# Patient Record
Sex: Male | Born: 1960 | Race: White | Hispanic: No | Marital: Single | Smoking: Former smoker
Health system: Southern US, Community
[De-identification: ages and names within clinical notes are randomized; demographics above are authoritative.]

## PROBLEM LIST (undated history)

## (undated) DIAGNOSIS — E7800 Pure hypercholesterolemia, unspecified: Secondary | ICD-10-CM

## (undated) DIAGNOSIS — I10 Essential (primary) hypertension: Secondary | ICD-10-CM

## (undated) DIAGNOSIS — M1990 Unspecified osteoarthritis, unspecified site: Secondary | ICD-10-CM

## (undated) DIAGNOSIS — J439 Emphysema, unspecified: Secondary | ICD-10-CM

## (undated) HISTORY — PX: HERNIA REPAIR: SHX51

## (undated) HISTORY — PX: COLONOSCOPY: SHX174

---

## 2002-07-29 ENCOUNTER — Ambulatory Visit (HOSPITAL_COMMUNITY): Admission: RE | Admit: 2002-07-29 | Discharge: 2002-07-29 | Payer: Self-pay | Admitting: Internal Medicine

## 2004-08-22 ENCOUNTER — Emergency Department (HOSPITAL_COMMUNITY): Admission: EM | Admit: 2004-08-22 | Discharge: 2004-08-22 | Payer: Self-pay | Admitting: Family Medicine

## 2004-08-22 ENCOUNTER — Ambulatory Visit (HOSPITAL_COMMUNITY): Admission: RE | Admit: 2004-08-22 | Discharge: 2004-08-22 | Payer: Self-pay | Admitting: Family Medicine

## 2008-01-22 ENCOUNTER — Ambulatory Visit: Payer: Self-pay | Admitting: Family Medicine

## 2013-12-24 ENCOUNTER — Emergency Department: Payer: Self-pay | Admitting: Emergency Medicine

## 2013-12-24 LAB — BASIC METABOLIC PANEL
ANION GAP: 8 (ref 7–16)
BUN: 17 mg/dL (ref 7–18)
Calcium, Total: 8.7 mg/dL (ref 8.5–10.1)
Chloride: 104 mmol/L (ref 98–107)
Co2: 26 mmol/L (ref 21–32)
Creatinine: 1.17 mg/dL (ref 0.60–1.30)
EGFR (Non-African Amer.): >60
Glucose: 104 mg/dL — ABNORMAL HIGH (ref 65–99)
Osmolality: 278 (ref 275–301)
Potassium: 3.2 mmol/L — ABNORMAL LOW (ref 3.5–5.1)
SODIUM: 138 mmol/L (ref 136–145)

## 2013-12-24 LAB — CBC
HCT: 43 % (ref 40.0–52.0)
HGB: 15.1 g/dL (ref 13.0–18.0)
MCH: 33.5 pg (ref 26.0–34.0)
MCHC: 35.2 g/dL (ref 32.0–36.0)
MCV: 95 fL (ref 80–100)
PLATELETS: 151 10*3/uL (ref 150–440)
RBC: 4.52 10*6/uL (ref 4.40–5.90)
RDW: 12.7 % (ref 11.5–14.5)
WBC: 6.3 10*3/uL (ref 3.8–10.6)

## 2013-12-24 LAB — TROPONIN I: Troponin-I: <0.02 ng/mL

## 2017-11-14 ENCOUNTER — Telehealth: Payer: Self-pay | Admitting: *Deleted

## 2017-11-14 DIAGNOSIS — Z122 Encounter for screening for malignant neoplasm of respiratory organs: Secondary | ICD-10-CM

## 2017-11-14 NOTE — Telephone Encounter (Signed)
Received a referral for initial lung cancer screening scan.  Contacted the patient and obtained their smoking history, CURRENT SMOKER, SMOKE SOME YOUNGER BUT STARTED REGULAR AT 57 YEARS OLD, 33 PKYR HISTORY as well as answering questions related to screening process.  Patient denies signs of lung cancer such as weight loss or hemoptysis at this time.  Patient denies comorbidity that would prevent curative treatment if lung cancer were found.  Patient is scheduled for the Shared Decision Making Visit and CT scan on 9-12@1430 .

## 2017-11-14 NOTE — Telephone Encounter (Signed)
Received referral for low dose lung cancer screening CT scan.  Unable to leave message at this time, will attempt call at later point.       

## 2017-12-04 ENCOUNTER — Ambulatory Visit: Payer: Self-pay | Admitting: Nurse Practitioner

## 2017-12-04 ENCOUNTER — Inpatient Hospital Stay: Payer: BLUE CROSS/BLUE SHIELD | Attending: Nurse Practitioner | Admitting: Nurse Practitioner

## 2017-12-04 ENCOUNTER — Encounter: Payer: Self-pay | Admitting: Nurse Practitioner

## 2017-12-04 ENCOUNTER — Ambulatory Visit
Admission: RE | Admit: 2017-12-04 | Discharge: 2017-12-04 | Disposition: A | Discharge status: Home / Self Care | Payer: BLUE CROSS/BLUE SHIELD | Source: Ambulatory Visit | Attending: Nurse Practitioner | Admitting: Nurse Practitioner

## 2017-12-04 DIAGNOSIS — J432 Centrilobular emphysema: Secondary | ICD-10-CM | POA: Diagnosis not present

## 2017-12-04 DIAGNOSIS — I2510 Atherosclerotic heart disease of native coronary artery without angina pectoris: Secondary | ICD-10-CM | POA: Diagnosis not present

## 2017-12-04 DIAGNOSIS — Z122 Encounter for screening for malignant neoplasm of respiratory organs: Secondary | ICD-10-CM | POA: Diagnosis present

## 2017-12-04 DIAGNOSIS — I700 Atherosclerosis of aorta: Secondary | ICD-10-CM | POA: Diagnosis not present

## 2017-12-04 DIAGNOSIS — Z87891 Personal history of nicotine dependence: Secondary | ICD-10-CM | POA: Insufficient documentation

## 2017-12-04 NOTE — Progress Notes (Signed)
In accordance with CMS guidelines, patient has met eligibility criteria including age, absence of signs or symptoms of lung cancer.  Social History   Tobacco Use  . Smoking status: Current Every Day Smoker    Packs/day: 1.50    Years: 22.00    Pack years: 33.00    Types: Cigarettes  Substance Use Topics  . Alcohol use: Not on file  . Drug use: Not on file      A shared decision-making session was conducted prior to the performance of CT scan. This includes one or more decision aids, includes benefits and harms of screening, follow-up diagnostic testing, over-diagnosis, false positive rate, and total radiation exposure.   Counseling on the importance of adherence to annual lung cancer LDCT screening, impact of co-morbidities, and ability or willingness to undergo diagnosis and treatment is imperative for compliance of the program.   Counseling on the importance of continued smoking cessation for former smokers; the importance of smoking cessation for current smokers, and information about tobacco cessation interventions have been given to patient including [place] and [facility] quit [place] programs.   Written order for lung cancer screening with LDCT has been given to the patient and any and all questions have been answered to the best of my abilities.    Yearly follow up will be coordinated by Burgess Estelle, Thoracic Navigator.  Beckey Rutter, DNP, AGNP-C [place] [facility] [phone] (work cell) [phone] (office) 12/04/17 2:23 PM

## 2017-12-05 ENCOUNTER — Encounter: Payer: Self-pay | Admitting: *Deleted

## 2017-12-10 ENCOUNTER — Other Ambulatory Visit: Payer: Self-pay

## 2017-12-10 ENCOUNTER — Encounter: Payer: Self-pay | Admitting: *Deleted

## 2017-12-11 NOTE — Discharge Instructions (Signed)

## 2017-12-16 ENCOUNTER — Encounter
Admission: RE | Disposition: A | Discharge status: Home / Self Care | Payer: Self-pay | Source: Ambulatory Visit | Attending: Ophthalmology

## 2017-12-16 ENCOUNTER — Ambulatory Visit
Admission: RE | Admit: 2017-12-16 | Discharge: 2017-12-16 | Disposition: A | Discharge status: Home / Self Care | Payer: BLUE CROSS/BLUE SHIELD | Source: Ambulatory Visit | Attending: Ophthalmology | Admitting: Ophthalmology

## 2017-12-16 ENCOUNTER — Ambulatory Visit: Payer: BLUE CROSS/BLUE SHIELD | Admitting: Anesthesiology

## 2017-12-16 DIAGNOSIS — H2511 Age-related nuclear cataract, right eye: Secondary | ICD-10-CM | POA: Diagnosis not present

## 2017-12-16 DIAGNOSIS — Z79899 Other long term (current) drug therapy: Secondary | ICD-10-CM | POA: Insufficient documentation

## 2017-12-16 DIAGNOSIS — I10 Essential (primary) hypertension: Secondary | ICD-10-CM | POA: Diagnosis not present

## 2017-12-16 DIAGNOSIS — J439 Emphysema, unspecified: Secondary | ICD-10-CM | POA: Insufficient documentation

## 2017-12-16 DIAGNOSIS — F17200 Nicotine dependence, unspecified, uncomplicated: Secondary | ICD-10-CM | POA: Insufficient documentation

## 2017-12-16 HISTORY — DX: Essential (primary) hypertension: I10

## 2017-12-16 HISTORY — DX: Emphysema, unspecified: J43.9

## 2017-12-16 HISTORY — DX: Unspecified osteoarthritis, unspecified site: M19.90

## 2017-12-16 HISTORY — DX: Pure hypercholesterolemia, unspecified: E78.00

## 2017-12-16 HISTORY — PX: CATARACT EXTRACTION W/PHACO: SHX586

## 2017-12-16 SURGERY — PHACOEMULSIFICATION, CATARACT, WITH IOL INSERTION
Anesthesia: Monitor Anesthesia Care | Site: Eye | Laterality: Right

## 2017-12-16 MED ORDER — LACTATED RINGERS IV SOLN: 10.0000 mL/h | INTRAVENOUS | Status: DC

## 2017-12-16 MED ORDER — ONDANSETRON HCL 4 MG/2ML IJ SOLN: 4.0000 mg | Freq: Once | INTRAMUSCULAR | Status: DC | PRN

## 2017-12-16 MED ORDER — LIDOCAINE HCL (PF) 2 % IJ SOLN
INTRAOCULAR | Status: DC | PRN
  Administered 2017-12-16: 1 mL

## 2017-12-16 MED ORDER — FENTANYL CITRATE (PF) 100 MCG/2ML IJ SOLN
INTRAMUSCULAR | Status: DC | PRN
  Administered 2017-12-16: 50 ug via INTRAVENOUS

## 2017-12-16 MED ORDER — NA HYALUR & NA CHOND-NA HYALUR 0.4-0.35 ML IO KIT
PACK | INTRAOCULAR | Status: DC | PRN
  Administered 2017-12-16: 1 mL via INTRAOCULAR

## 2017-12-16 MED ORDER — EPINEPHRINE PF 1 MG/ML IJ SOLN
INTRAOCULAR | Status: DC | PRN
  Administered 2017-12-16: 56 mL via OPHTHALMIC

## 2017-12-16 MED ORDER — TETRACAINE HCL 0.5 % OP SOLN
1.0000 [drp] | OPHTHALMIC | Status: DC | PRN
  Administered 2017-12-16 (×2): 1 [drp] via OPHTHALMIC

## 2017-12-16 MED ORDER — ARMC OPHTHALMIC DILATING DROPS
1.0000 "application " | OPHTHALMIC | Status: DC | PRN
  Administered 2017-12-16 (×3): 1 via OPHTHALMIC

## 2017-12-16 MED ORDER — MOXIFLOXACIN HCL 0.5 % OP SOLN
1.0000 [drp] | OPHTHALMIC | Status: DC | PRN
  Administered 2017-12-16 (×3): 1 [drp] via OPHTHALMIC

## 2017-12-16 MED ORDER — CEFUROXIME OPHTHALMIC INJECTION 1 MG/0.1 ML
INJECTION | OPHTHALMIC | Status: DC | PRN
  Administered 2017-12-16: 0.1 mL via INTRACAMERAL

## 2017-12-16 MED ORDER — BRIMONIDINE TARTRATE-TIMOLOL 0.2-0.5 % OP SOLN
OPHTHALMIC | Status: DC | PRN
  Administered 2017-12-16: 1 [drp] via OPHTHALMIC

## 2017-12-16 MED ORDER — MIDAZOLAM HCL 2 MG/2ML IJ SOLN
INTRAMUSCULAR | Status: DC | PRN
  Administered 2017-12-16: 2 mg via INTRAVENOUS

## 2017-12-16 SURGICAL SUPPLY — 20 items
CANNULA ANT/CHMB 27G (MISCELLANEOUS) ×1 IMPLANT
CANNULA ANT/CHMB 27GA (MISCELLANEOUS) ×3 IMPLANT
GLOVE SURG LX 7.5 STRW (GLOVE) ×2
GLOVE SURG LX STRL 7.5 STRW (GLOVE) ×1 IMPLANT
GLOVE SURG TRIUMPH 8.0 PF LTX (GLOVE) ×3 IMPLANT
GOWN STRL REUS W/ TWL LRG LVL3 (GOWN DISPOSABLE) ×2 IMPLANT
GOWN STRL REUS W/TWL LRG LVL3 (GOWN DISPOSABLE) ×4
LENS IOL TECNIS ITEC 21.0 (Intraocular Lens) ×2 IMPLANT
MARKER SKIN DUAL TIP RULER LAB (MISCELLANEOUS) ×3 IMPLANT
NDL FILTER BLUNT 18X1 1/2 (NEEDLE) ×1 IMPLANT
NEEDLE FILTER BLUNT 18X 1/2SAF (NEEDLE) ×2
NEEDLE FILTER BLUNT 18X1 1/2 (NEEDLE) ×1 IMPLANT
PACK CATARACT BRASINGTON (MISCELLANEOUS) ×3 IMPLANT
PACK EYE AFTER SURG (MISCELLANEOUS) ×3 IMPLANT
PACK OPTHALMIC (MISCELLANEOUS) ×3 IMPLANT
SYR 3ML LL SCALE MARK (SYRINGE) ×3 IMPLANT
SYR 5ML LL (SYRINGE) ×3 IMPLANT
SYR TB 1ML LUER SLIP (SYRINGE) ×3 IMPLANT
WATER STERILE IRR 500ML POUR (IV SOLUTION) ×3 IMPLANT
WIPE NON LINTING 3.25X3.25 (MISCELLANEOUS) ×3 IMPLANT

## 2017-12-16 NOTE — Anesthesia Procedure Notes (Signed)
Procedure Name: [place] Performed by: Cameron Ali, CRNA Pre-anesthesia Checklist: Patient identified, Emergency Drugs available, Suction available, Timeout performed and Patient being monitored Patient Re-evaluated:Patient Re-evaluated prior to induction Oxygen Delivery Method: Nasal cannula Placement Confirmation: positive ETCO2

## 2017-12-16 NOTE — Transfer of Care (Signed)
Immediate Anesthesia Transfer of Care Note  Patient: Cory Macias  Procedure(s) Performed: CATARACT EXTRACTION PHACO AND INTRAOCULAR LENS PLACEMENT (IOC) RIGHT (Right Eye)  Patient Location: PACU  Anesthesia Type: MAC  Level of Consciousness: awake, alert  and patient cooperative  Airway and Oxygen Therapy: Patient Spontanous Breathing and Patient connected to supplemental oxygen  Post-op Assessment: Post-op Vital signs reviewed, Patient's Cardiovascular Status Stable, Respiratory Function Stable, Patent Airway and No signs of Nausea or vomiting  Post-op Vital Signs: Reviewed and stable  Complications: No apparent anesthesia complications

## 2017-12-16 NOTE — Op Note (Signed)
LOCATION:  [facility]   PREOPERATIVE DIAGNOSIS:    Nuclear sclerotic cataract right eye. H25.11   POSTOPERATIVE DIAGNOSIS:  Nuclear sclerotic cataract right eye.     PROCEDURE:  Phacoemusification with posterior chamber intraocular lens placement of the right eye   LENS:   Implant Name Type Inv. Item Serial No. Manufacturer Lot No. LRB No. Used  LENS IOL DIOP 21.0 - [id number] Intraocular Lens LENS IOL DIOP 21.0 [id number]  Right 1        ULTRASOUND TIME: 13 % of 1 minutes, 7 seconds.  CDE 8.5   SURGEON:  Deirdre Evenerhadwick R. Nakyra Bourn, MD   ANESTHESIA:  Topical with tetracaine drops and 2% Xylocaine jelly, augmented with 1% preservative-free intracameral lidocaine.    COMPLICATIONS:  None.   DESCRIPTION OF PROCEDURE:  The patient was identified in the holding room and transported to the operating room and placed in the supine position under the operating microscope.  The right eye was identified as the operative eye and it was prepped and draped in the usual sterile ophthalmic fashion.   A 1 millimeter clear-corneal paracentesis was made at the 12:00 position.  0.5 ml of preservative-free 1% lidocaine was injected into the anterior chamber. The anterior chamber was filled with Viscoat viscoelastic.  A 2.4 millimeter keratome was used to make a near-clear corneal incision at the 9:00 position.  A curvilinear capsulorrhexis was made with a cystotome and capsulorrhexis forceps.  Balanced salt solution was used to hydrodissect and hydrodelineate the nucleus.   Phacoemulsification was then used in stop and chop fashion to remove the lens nucleus and epinucleus.  The remaining cortex was then removed using the irrigation and aspiration handpiece. Provisc was then placed into the capsular bag to distend it for lens placement.  A lens was then injected into the capsular bag.  The remaining viscoelastic was aspirated.   Wounds were hydrated with balanced salt solution.  The anterior  chamber was inflated to a physiologic pressure with balanced salt solution.  No wound leaks were noted. Cefuroxime 0.1 ml of a 10mg /ml solution was injected into the anterior chamber for a dose of 1 mg of intracameral antibiotic at the completion of the case.   Timolol and Brimonidine drops were applied to the eye.  The patient was taken to the recovery room in stable condition without complications of anesthesia or surgery.   Delayni Streed 12/16/2017, 12:27 PM

## 2017-12-16 NOTE — H&P (Signed)
The History and Physical notes are on paper, have been signed, and are to be scanned. The patient remains stable and unchanged from the H&P.   Previous H&P reviewed, patient examined, and there are no changes.  Cory Macias 12/16/2017 11:24 AM   

## 2017-12-16 NOTE — Anesthesia Postprocedure Evaluation (Signed)
Anesthesia Post Note  Patient: Cory Macias  Procedure(s) Performed: CATARACT EXTRACTION PHACO AND INTRAOCULAR LENS PLACEMENT (IOC) RIGHT (Right Eye)  Patient location during evaluation: PACU Anesthesia Type: MAC Level of consciousness: awake and alert Pain management: pain level controlled Vital Signs Assessment: post-procedure vital signs reviewed and stable Respiratory status: spontaneous breathing, nonlabored ventilation, respiratory function stable and patient connected to nasal cannula oxygen Cardiovascular status: stable and blood pressure returned to baseline Postop Assessment: no apparent nausea or vomiting Anesthetic complications: no    Veda Canning

## 2017-12-16 NOTE — Anesthesia Preprocedure Evaluation (Signed)
Anesthesia Evaluation  Patient identified by MRN, date of birth, ID band Patient awake    Reviewed: Allergy & Precautions, NPO status , Patient's Chart, lab work & pertinent test results  Airway Mallampati: II  TM Distance: >3 FB     Dental   Pulmonary COPD, Current Smoker,    breath sounds clear to auscultation       Cardiovascular hypertension,  Rhythm:Regular Rate:Normal  HLD   Neuro/Psych    GI/Hepatic   Endo/Other  BMI 36  Renal/GU      Musculoskeletal  (+) Arthritis ,   Abdominal   Peds  Hematology   Anesthesia Other Findings   Reproductive/Obstetrics                             Anesthesia Physical Anesthesia Plan  ASA: III  Anesthesia Plan: MAC   Post-op Pain Management:    Induction:   PONV Risk Score and Plan:   Airway Management Planned: Nasal Cannula and Natural Airway  Additional Equipment:   Intra-op Plan:   Post-operative Plan:   Informed Consent: I have reviewed the patients History and Physical, chart, labs and discussed the procedure including the risks, benefits and alternatives for the proposed anesthesia with the patient or authorized representative who has indicated his/her understanding and acceptance.     Plan Discussed with: CRNA  Anesthesia Plan Comments:         Anesthesia Quick Evaluation

## 2017-12-17 ENCOUNTER — Encounter: Payer: Self-pay | Admitting: Ophthalmology

## 2018-01-19 DIAGNOSIS — H2512 Age-related nuclear cataract, left eye: Secondary | ICD-10-CM | POA: Diagnosis not present

## 2018-01-21 ENCOUNTER — Other Ambulatory Visit: Payer: Self-pay

## 2018-01-21 ENCOUNTER — Encounter: Payer: Self-pay | Admitting: *Deleted

## 2018-01-21 NOTE — Anesthesia Preprocedure Evaluation (Addendum)
Anesthesia Evaluation  Patient identified by MRN, date of birth, ID band Patient awake    Reviewed: Allergy & Precautions, NPO status , Patient's Chart, lab work & pertinent test results  Airway Mallampati: III  TM Distance: >3 FB Neck ROM: Full    Dental  (+)    Pulmonary COPD (emphysema), Current Smoker (1/2 ppd),    Pulmonary exam normal breath sounds clear to auscultation       Cardiovascular Exercise Tolerance: Good hypertension, Normal cardiovascular exam Rhythm:Regular Rate:Normal     Neuro/Psych negative neurological ROS     GI/Hepatic negative GI ROS,   Endo/Other  negative endocrine ROS  Renal/GU Hx nephrolithiasis     Musculoskeletal  (+) Arthritis , Gout    Abdominal   Peds  Hematology negative hematology ROS (+)   Anesthesia Other Findings   Reproductive/Obstetrics                            Anesthesia Physical Anesthesia Plan  ASA: II  Anesthesia Plan: MAC   Post-op Pain Management:    Induction: Intravenous  PONV Risk Score and Plan: TIVA and Midazolam  Airway Management Planned: Natural Airway  Additional Equipment:   Intra-op Plan:   Post-operative Plan:   Informed Consent: I have reviewed the patients History and Physical, chart, labs and discussed the procedure including the risks, benefits and alternatives for the proposed anesthesia with the patient or authorized representative who has indicated his/her understanding and acceptance.     Plan Discussed with: CRNA  Anesthesia Plan Comments:        Anesthesia Quick Evaluation

## 2018-01-22 NOTE — Discharge Instructions (Signed)

## 2018-01-28 ENCOUNTER — Ambulatory Visit
Admission: RE | Admit: 2018-01-28 | Discharge: 2018-01-28 | Disposition: A | Discharge status: Home / Self Care | Payer: 59 | Source: Ambulatory Visit | Attending: Ophthalmology | Admitting: Ophthalmology

## 2018-01-28 ENCOUNTER — Ambulatory Visit: Payer: 59 | Admitting: Anesthesiology

## 2018-01-28 ENCOUNTER — Encounter
Admission: RE | Disposition: A | Discharge status: Home / Self Care | Payer: Self-pay | Source: Ambulatory Visit | Attending: Ophthalmology

## 2018-01-28 DIAGNOSIS — F17200 Nicotine dependence, unspecified, uncomplicated: Secondary | ICD-10-CM | POA: Insufficient documentation

## 2018-01-28 DIAGNOSIS — Z9841 Cataract extraction status, right eye: Secondary | ICD-10-CM | POA: Diagnosis not present

## 2018-01-28 DIAGNOSIS — R609 Edema, unspecified: Secondary | ICD-10-CM | POA: Insufficient documentation

## 2018-01-28 DIAGNOSIS — Z87442 Personal history of urinary calculi: Secondary | ICD-10-CM | POA: Insufficient documentation

## 2018-01-28 DIAGNOSIS — H2512 Age-related nuclear cataract, left eye: Secondary | ICD-10-CM | POA: Diagnosis present

## 2018-01-28 DIAGNOSIS — E7800 Pure hypercholesterolemia, unspecified: Secondary | ICD-10-CM | POA: Insufficient documentation

## 2018-01-28 DIAGNOSIS — J439 Emphysema, unspecified: Secondary | ICD-10-CM | POA: Insufficient documentation

## 2018-01-28 DIAGNOSIS — R05 Cough: Secondary | ICD-10-CM | POA: Diagnosis not present

## 2018-01-28 DIAGNOSIS — M109 Gout, unspecified: Secondary | ICD-10-CM | POA: Insufficient documentation

## 2018-01-28 DIAGNOSIS — H25812 Combined forms of age-related cataract, left eye: Secondary | ICD-10-CM | POA: Diagnosis not present

## 2018-01-28 DIAGNOSIS — I10 Essential (primary) hypertension: Secondary | ICD-10-CM | POA: Diagnosis not present

## 2018-01-28 DIAGNOSIS — Z79899 Other long term (current) drug therapy: Secondary | ICD-10-CM | POA: Insufficient documentation

## 2018-01-28 DIAGNOSIS — M1990 Unspecified osteoarthritis, unspecified site: Secondary | ICD-10-CM | POA: Diagnosis not present

## 2018-01-28 HISTORY — PX: CATARACT EXTRACTION W/PHACO: SHX586

## 2018-01-28 SURGERY — PHACOEMULSIFICATION, CATARACT, WITH IOL INSERTION
Anesthesia: Monitor Anesthesia Care | Site: Eye | Laterality: Left

## 2018-01-28 MED ORDER — BRIMONIDINE TARTRATE-TIMOLOL 0.2-0.5 % OP SOLN
OPHTHALMIC | Status: DC | PRN
  Administered 2018-01-28: 1 [drp] via OPHTHALMIC

## 2018-01-28 MED ORDER — CEFUROXIME OPHTHALMIC INJECTION 1 MG/0.1 ML
INJECTION | OPHTHALMIC | Status: DC | PRN
  Administered 2018-01-28: 0.1 mL via INTRACAMERAL

## 2018-01-28 MED ORDER — TETRACAINE HCL 0.5 % OP SOLN
1.0000 [drp] | OPHTHALMIC | Status: DC | PRN
  Administered 2018-01-28 (×2): 1 [drp] via OPHTHALMIC

## 2018-01-28 MED ORDER — MIDAZOLAM HCL 2 MG/2ML IJ SOLN
INTRAMUSCULAR | Status: DC | PRN
  Administered 2018-01-28: 2 mg via INTRAVENOUS

## 2018-01-28 MED ORDER — LIDOCAINE HCL (PF) 2 % IJ SOLN
INTRAOCULAR | Status: DC | PRN
  Administered 2018-01-28: 1 mL

## 2018-01-28 MED ORDER — FENTANYL CITRATE (PF) 100 MCG/2ML IJ SOLN
INTRAMUSCULAR | Status: DC | PRN
  Administered 2018-01-28 (×2): 50 ug via INTRAVENOUS

## 2018-01-28 MED ORDER — ONDANSETRON HCL 4 MG/2ML IJ SOLN: 4.0000 mg | Freq: Once | INTRAMUSCULAR | Status: DC | PRN

## 2018-01-28 MED ORDER — NA HYALUR & NA CHOND-NA HYALUR 0.4-0.35 ML IO KIT
PACK | INTRAOCULAR | Status: DC | PRN
  Administered 2018-01-28: 1 mL via INTRAOCULAR

## 2018-01-28 MED ORDER — LACTATED RINGERS IV SOLN: INTRAVENOUS | Status: DC

## 2018-01-28 MED ORDER — ERYTHROMYCIN 5 MG/GM OP OINT
TOPICAL_OINTMENT | OPHTHALMIC | Status: DC | PRN
  Administered 2018-01-28: 1 via OPHTHALMIC

## 2018-01-28 MED ORDER — ARMC OPHTHALMIC DILATING DROPS
1.0000 "application " | OPHTHALMIC | Status: DC | PRN
  Administered 2018-01-28 (×3): 1 via OPHTHALMIC

## 2018-01-28 MED ORDER — ACETAMINOPHEN 325 MG PO TABS: 650.0000 mg | ORAL_TABLET | Freq: Once | ORAL | Status: DC | PRN

## 2018-01-28 MED ORDER — MOXIFLOXACIN HCL 0.5 % OP SOLN
1.0000 [drp] | OPHTHALMIC | Status: DC | PRN
  Administered 2018-01-28 (×3): 1 [drp] via OPHTHALMIC

## 2018-01-28 MED ORDER — ACETAMINOPHEN 160 MG/5ML PO SOLN: 325.0000 mg | ORAL | Status: DC | PRN

## 2018-01-28 MED ORDER — EPINEPHRINE PF 1 MG/ML IJ SOLN
INTRAOCULAR | Status: DC | PRN
  Administered 2018-01-28: 60 mL via OPHTHALMIC

## 2018-01-28 SURGICAL SUPPLY — 20 items
CANNULA ANT/CHMB 27G (MISCELLANEOUS) ×1 IMPLANT
CANNULA ANT/CHMB 27GA (MISCELLANEOUS) ×3 IMPLANT
GLOVE SURG LX 7.5 STRW (GLOVE) ×2
GLOVE SURG LX STRL 7.5 STRW (GLOVE) ×1 IMPLANT
GLOVE SURG TRIUMPH 8.0 PF LTX (GLOVE) ×3 IMPLANT
GOWN STRL REUS W/ TWL LRG LVL3 (GOWN DISPOSABLE) ×2 IMPLANT
GOWN STRL REUS W/TWL LRG LVL3 (GOWN DISPOSABLE) ×4
LENS IOL TECNIS ITEC 20.5 (Intraocular Lens) ×2 IMPLANT
MARKER SKIN DUAL TIP RULER LAB (MISCELLANEOUS) ×3 IMPLANT
NDL FILTER BLUNT 18X1 1/2 (NEEDLE) ×1 IMPLANT
NEEDLE FILTER BLUNT 18X 1/2SAF (NEEDLE) ×2
NEEDLE FILTER BLUNT 18X1 1/2 (NEEDLE) ×1 IMPLANT
PACK CATARACT BRASINGTON (MISCELLANEOUS) ×3 IMPLANT
PACK EYE AFTER SURG (MISCELLANEOUS) ×3 IMPLANT
PACK OPTHALMIC (MISCELLANEOUS) ×3 IMPLANT
SYR 3ML LL SCALE MARK (SYRINGE) ×3 IMPLANT
SYR 5ML LL (SYRINGE) ×3 IMPLANT
SYR TB 1ML LUER SLIP (SYRINGE) ×3 IMPLANT
WATER STERILE IRR 500ML POUR (IV SOLUTION) ×3 IMPLANT
WIPE NON LINTING 3.25X3.25 (MISCELLANEOUS) ×3 IMPLANT

## 2018-01-28 NOTE — Anesthesia Postprocedure Evaluation (Signed)
Anesthesia Post Note  Patient: Cory Macias  Procedure(s) Performed: CATARACT EXTRACTION PHACO AND INTRAOCULAR LENS PLACEMENT (IOC)LEFT (Left Eye)  Patient location during evaluation: PACU Anesthesia Type: MAC Level of consciousness: awake and alert, oriented and patient cooperative Pain management: pain level controlled Vital Signs Assessment: post-procedure vital signs reviewed and stable Respiratory status: spontaneous breathing, nonlabored ventilation and respiratory function stable Cardiovascular status: blood pressure returned to baseline and stable Postop Assessment: adequate PO intake Anesthetic complications: no    Darrin Nipper

## 2018-01-28 NOTE — Transfer of Care (Signed)
Immediate Anesthesia Transfer of Care Note  Patient: Cory Macias  Procedure(s) Performed: CATARACT EXTRACTION PHACO AND INTRAOCULAR LENS PLACEMENT (IOC)LEFT (Left Eye)  Patient Location: PACU  Anesthesia Type: MAC  Level of Consciousness: awake, alert  and patient cooperative  Airway and Oxygen Therapy: Patient Spontanous Breathing and Patient connected to supplemental oxygen  Post-op Assessment: Post-op Vital signs reviewed, Patient's Cardiovascular Status Stable, Respiratory Function Stable, Patent Airway and No signs of Nausea or vomiting  Post-op Vital Signs: Reviewed and stable  Complications: No apparent anesthesia complications

## 2018-01-28 NOTE — Anesthesia Procedure Notes (Signed)
Procedure Name: MAC Date/Time: 01/28/2018 9:34 AM Performed by: Jeannene Patella, CRNA Pre-anesthesia Checklist: Patient identified, Emergency Drugs available, Suction available, Patient being monitored and Timeout performed Patient Re-evaluated:Patient Re-evaluated prior to induction Oxygen Delivery Method: Nasal cannula Induction Type: IV induction

## 2018-01-28 NOTE — Op Note (Signed)
OPERATIVE NOTE  Cory Macias [id number] 01/28/2018   PREOPERATIVE DIAGNOSIS:  Nuclear sclerotic cataract left eye. H25.12   POSTOPERATIVE DIAGNOSIS:    Nuclear sclerotic cataract left eye.     PROCEDURE:  Phacoemusification with posterior chamber intraocular lens placement of the left eye   LENS:   Implant Name Type Inv. Item Serial No. Manufacturer Lot No. LRB No. Used  LENS IOL DIOP 20.5 - [id number] Intraocular Lens LENS IOL DIOP 20.5 [id number]  Left 1        ULTRASOUND TIME: 14  % of 1 minutes 0 seconds, CDE 8.4  SURGEON:  Deirdre Evener, MD   ANESTHESIA:  Topical with tetracaine drops and 2% Xylocaine jelly, augmented with 1% preservative-free intracameral lidocaine.    COMPLICATIONS:  None.   DESCRIPTION OF PROCEDURE:  The patient was identified in the holding room and transported to the operating room and placed in the supine position under the operating microscope.  The left eye was identified as the operative eye and it was prepped and draped in the usual sterile ophthalmic fashion.   A 1 millimeter clear-corneal paracentesis was made at the 1:30 position.  0.5 ml of preservative-free 1% lidocaine was injected into the anterior chamber.  The anterior chamber was filled with Viscoat viscoelastic.  A 2.4 millimeter keratome was used to make a near-clear corneal incision at the 10:30 position.  .  A curvilinear capsulorrhexis was made with a cystotome and capsulorrhexis forceps.  Balanced salt solution was used to hydrodissect and hydrodelineate the nucleus.   Phacoemulsification was then used in stop and chop fashion to remove the lens nucleus and epinucleus.  The remaining cortex was then removed using the irrigation and aspiration handpiece. Provisc was then placed into the capsular bag to distend it for lens placement.  A lens was then injected into the capsular bag.  The remaining viscoelastic was aspirated.   Wounds were hydrated with balanced salt solution.   The anterior chamber was inflated to a physiologic pressure with balanced salt solution.  No wound leaks were noted. Cefuroxime 0.1 ml of a 10mg /ml solution was injected into the anterior chamber for a dose of 1 mg of intracameral antibiotic at the completion of the case.   Timolol and Brimonidine drops and erythromycin ointment were applied to the eye.  The patient was taken to the recovery room in stable condition without complications of anesthesia or surgery.  Hank Walling 01/28/2018, 9:49 AM

## 2018-01-28 NOTE — H&P (Signed)
The History and Physical notes are on paper, have been signed, and are to be scanned. The patient remains stable and unchanged from the H&P.   Previous H&P reviewed, patient examined, and there are no changes.  Cory Macias 01/28/2018 8:59 AM

## 2018-01-29 ENCOUNTER — Encounter: Payer: Self-pay | Admitting: Ophthalmology

## 2018-05-08 DIAGNOSIS — I10 Essential (primary) hypertension: Secondary | ICD-10-CM | POA: Diagnosis not present

## 2018-05-08 DIAGNOSIS — M109 Gout, unspecified: Secondary | ICD-10-CM | POA: Diagnosis not present

## 2018-08-12 DIAGNOSIS — M109 Gout, unspecified: Secondary | ICD-10-CM | POA: Diagnosis not present

## 2018-08-12 DIAGNOSIS — I10 Essential (primary) hypertension: Secondary | ICD-10-CM | POA: Diagnosis not present

## 2018-12-08 ENCOUNTER — Telehealth: Payer: Self-pay | Admitting: *Deleted

## 2018-12-08 NOTE — Telephone Encounter (Signed)
Patient has been notified that lung cancer screening CT scan is due currently or will be in the near future. Patient states due to work obligations he does not have much flexibility at this time. He would to call back at a later time to scheduled scan. Patient was given CB# [phone].

## 2019-06-18 ENCOUNTER — Telehealth: Payer: Self-pay | Admitting: *Deleted

## 2019-06-18 DIAGNOSIS — Z87891 Personal history of nicotine dependence: Secondary | ICD-10-CM

## 2019-06-18 NOTE — Addendum Note (Signed)
Addended by: Jonne Ply on: 06/18/2019 03:06 PM   Modules accepted: Orders

## 2019-06-18 NOTE — Telephone Encounter (Signed)
(  06/18/19) Pt has been notified that lung cancer screening CT scan is due currently or will be in near future. Confirmed pt is within appropriate age range, and asymptomatic. Pt denies illness that would prevent curative treatment for lung cancer if found. Verified smoking history (Current Smoker, 0.5 ppd ). Pt is agreeable for CT scan being scheduled.  SRW

## 2019-06-18 NOTE — Telephone Encounter (Signed)
Smoking history: current, 33.5 pack year 

## 2019-06-21 NOTE — Telephone Encounter (Signed)
Message left informing patient of low dose lung cancer screening CT scan appointment on 06/24/19 at 3:30pm.  Requested patient to call Glenna Fellows at [phone] if unable to keep appointment.

## 2019-06-24 ENCOUNTER — Ambulatory Visit: Payer: 59

## 2019-09-17 ENCOUNTER — Telehealth: Payer: Self-pay | Admitting: *Deleted

## 2019-09-17 NOTE — Telephone Encounter (Signed)
Patient was notified that is time for her Low Dose Lung Cancer Screening CT via Cel phone VM. I asked him to call back with information we need to schedule CT.

## 2019-09-17 NOTE — Telephone Encounter (Signed)
(  09/17/2019) Pt has been notified that lung cancer screening CT scan is due currently or will be in near future. Confirmed pt is within appropriate age range, and asymptomatic. Pt denies illness that would prevent curative treatment for lung cancer if found. Verified smoking history (Current Smoker,0.5 ppd ). Pt did receive 2nd COVID VX on (approx. Early May) [CT to be scheduled approx. 4 weeks after vx date] Pt is agreeable for CT scan being scheduled, and prefers an appt in August when his job slows down, in the afternoon (3pm-4pm) SRW

## 2019-09-17 NOTE — Telephone Encounter (Signed)
(  09/17/2019) Left message for pt to notify them that it is time to schedule annual low dose lung cancer screening CT scan. Instructed patient to call back to verify information prior to the scan being scheduled °SRW °  ° ° °

## 2019-09-22 NOTE — Telephone Encounter (Signed)
Voicemail left yesterday and today in attempt to schedule in August as requested by patient.

## 2020-01-27 ENCOUNTER — Other Ambulatory Visit: Payer: Self-pay

## 2020-01-27 ENCOUNTER — Ambulatory Visit
Admission: RE | Admit: 2020-01-27 | Discharge: 2020-01-27 | Disposition: A | Discharge status: Home / Self Care | Payer: 59 | Source: Ambulatory Visit | Attending: Physician Assistant | Admitting: Physician Assistant

## 2020-01-27 VITALS — BP 128/82 | HR 94 | Temp 98.2°F | Resp 18 | Ht 67.0 in | Wt 234.0 lb

## 2020-01-27 DIAGNOSIS — S161XXA Strain of muscle, fascia and tendon at neck level, initial encounter: Secondary | ICD-10-CM

## 2020-01-27 DIAGNOSIS — M5412 Radiculopathy, cervical region: Secondary | ICD-10-CM | POA: Diagnosis not present

## 2020-01-27 DIAGNOSIS — R252 Cramp and spasm: Secondary | ICD-10-CM | POA: Diagnosis not present

## 2020-01-27 MED ORDER — PREDNISONE 10 MG PO TABS: ORAL_TABLET | ORAL | 0 refills | Status: AC

## 2020-01-27 MED ORDER — HYDROCODONE-ACETAMINOPHEN 5-325 MG PO TABS: 2.0000 | ORAL_TABLET | Freq: Four times a day (QID) | ORAL | 0 refills | Status: AC | PRN

## 2020-01-27 MED ORDER — BACLOFEN 10 MG PO TABS: 10.0000 mg | ORAL_TABLET | Freq: Three times a day (TID) | ORAL | 0 refills | Status: AC | PRN

## 2020-01-27 NOTE — Discharge Instructions (Addendum)
NECK PAIN: Stressed avoiding painful activities. This can exacerbate your symptoms and make them worse.  May apply heat to the areas of pain for some relief. Use medications as directed. Be aware of which medications make you drowsy and do not drive or operate any kind of heavy machinery while using the medication (ie pain medications or muscle relaxers). F/U with PCP for reexamination or return sooner if condition worsens or does not begin to improve over the next few days.   NECK PAIN RED FLAGS: If symptoms get worse than they are right now, you should come back sooner for re-evaluation. If you have increased numbness/ tingling or notice that the numbness/tingling is affecting the legs or saddle region, go to ER. If you ever lose continence go to ER.     

## 2020-01-27 NOTE — ED Provider Notes (Signed)
[facility]    CSN: [id number] Arrival date & time: 01/27/20  1354      History   Chief Complaint Chief Complaint  Patient presents with  . Appointment  . Neck Pain  . Shoulder Pain    left    HPI Cory Macias is a 59 y.o. male presenting for 1.5-week history of left-sided neck pain and posterior shoulder pain with occasional radiation to the second and third digits.  He says that he has not had any specific injury, but does admit to carrying a lot of heavy things before onset of symptoms.  No trauma to the neck or shoulder.  Admits to having issues with his neck in the past, but has never received any specific diagnosis and denies any surgeries.  Denies any numbness, but has had occasional tingling in the above-mentioned digits.  He states he has tried everything he can think of without complete relief.  He states he has tried over-the-counter ibuprofen, naproxen, Tylenol, heat therapy and ice.  He denies any worsening of symptoms, but says they have not gotten better and he has issues sleeping at night.  He says he gets about an hour of sleep at night.  He has no other complaints or concerns at this time.  HPI  Past Medical History:  Diagnosis Date  . Arthritis    lower back  . Emphysema of lung (HCC)    mild  . Hypercholesteremia   . Hypertension     Patient Active Problem List   Diagnosis Date Noted  . Personal history of tobacco use, presenting hazards to health 12/04/2017    Past Surgical History:  Procedure Laterality Date  . CATARACT EXTRACTION W/PHACO Right 12/16/2017   Procedure: CATARACT EXTRACTION PHACO AND INTRAOCULAR LENS PLACEMENT (IOC) RIGHT;  Surgeon: Lockie Mola, MD;  Location: [facility];  Service: Ophthalmology;  Laterality: Right;  . CATARACT EXTRACTION W/PHACO Left 01/28/2018   Procedure: CATARACT EXTRACTION PHACO AND INTRAOCULAR LENS PLACEMENT (IOC)LEFT;  Surgeon: Lockie Mola, MD;  Location: [facility];   Service: Ophthalmology;  Laterality: Left;  . COLONOSCOPY    . HERNIA REPAIR     x2       Home Medications    Prior to Admission medications   Medication Sig Start Date End Date Taking? Authorizing Provider  acetaminophen (TYLENOL) 500 MG tablet Take 500 mg by mouth every 6 (six) hours as needed.   Yes [provider]  latanoprost (XALATAN) 0.005 % ophthalmic solution 1 drop at bedtime.   Yes [provider]  lisinopril-hydrochlorothiazide (PRINZIDE,ZESTORETIC) 20-25 MG tablet Take 1 tablet by mouth at bedtime.   Yes [provider]  Omega-3 Fatty Acids (FISH OIL) 1000 MG CAPS Take by mouth.   Yes [provider]  Potassium 99 MG TABS Take by mouth daily.   Yes [provider]  baclofen (LIORESAL) 10 MG tablet Take 1 tablet (10 mg total) by mouth 3 (three) times daily as needed for up to 10 days for muscle spasms. 01/27/20 02/06/20  Shirlee Latch, PA-C  HYDROcodone-acetaminophen (NORCO/VICODIN) 5-325 MG tablet Take 2 tablets by mouth every 6 (six) hours as needed for up to 3 days for severe pain. 01/27/20 01/30/20  Shirlee Latch, PA-C  predniSONE (DELTASONE) 10 MG tablet Take 6 tablets by mouth on the first day and decrease by 1 tablet daily for the next 5 days 01/27/20   Gareth Morgan    Family History Family History  Problem Relation  Age of Onset  . Cancer Mother   . Lung cancer Father   . Glaucoma Father   . Hypertension Father     Social History Social History   Tobacco Use  . Smoking status: Current Every Day Smoker    Packs/day: 0.25    Years: 22.00    Pack years: 5.50    Types: Cigarettes  . Smokeless tobacco: Never Used  Vaping Use  . Vaping Use: Never used  Substance Use Topics  . Alcohol use: Yes    Alcohol/week: 14.0 standard drinks    Types: 14 Standard drinks or equivalent per week    Comment: 2 drinks per evening  . Drug use: Never     Allergies   Patient has no known allergies.   Review of  Systems Review of Systems  Constitutional: Negative for fatigue and fever.  Musculoskeletal: Positive for arthralgias (L shoulder), back pain (chronic lower back pain, acute upper back pain), neck pain and neck stiffness. Negative for gait problem and joint swelling.  Skin: Negative for color change, rash and wound.  Neurological: Negative for weakness and numbness.     Physical Exam Triage Vital Signs ED Triage Vitals  Enc Vitals Group     BP 01/27/20 1404 128/82     Pulse Rate 01/27/20 1404 94     Resp 01/27/20 1404 18     Temp 01/27/20 1404 98.2 F (36.8 C)     Temp Source 01/27/20 1404 Oral     SpO2 01/27/20 1404 99 %     Weight 01/27/20 1404 234 lb (106.1 kg)     Height 01/27/20 1404 5\' 7"  (1.702 m)     Head Circumference --      Peak Flow --      Pain Score 01/27/20 1403 5     Pain Loc --      Pain Edu? --      Excl. in GC? --    No data found.  Updated Vital Signs BP 128/82 (BP Location: Left Arm)   Pulse 94   Temp 98.2 F (36.8 C) (Oral)   Resp 18   Ht 5\' 7"  (1.702 m)   Wt 234 lb (106.1 kg)   SpO2 99%   BMI 36.65 kg/m       Physical Exam Vitals and nursing note reviewed.  Constitutional:      General: He is not in acute distress.    Appearance: Normal appearance. He is well-developed. He is not ill-appearing or toxic-appearing.  HENT:     Head: Normocephalic and atraumatic.  Eyes:     General: No scleral icterus.    Conjunctiva/sclera: Conjunctivae normal.  Cardiovascular:     Rate and Rhythm: Normal rate and regular rhythm.     Heart sounds: Normal heart sounds.  Pulmonary:     Effort: Pulmonary effort is normal. No respiratory distress.     Breath sounds: Normal breath sounds.  Musculoskeletal:     Right shoulder: No bony tenderness. Normal range of motion. Normal strength.     Left shoulder: Normal strength.     Cervical back: Neck supple. Spasms, tenderness (left paracervical region, left posterior scapula and left upper paravertebral  thoracic muscles ) and bony tenderness (C5-C6- C7) present. Pain with movement present. Decreased range of motion (decreased extension and left sided rotation of neck ).  Skin:    General: Skin is warm and dry.  Neurological:     General: No focal deficit present.  Mental Status: He is alert. Mental status is at baseline.     Motor: No weakness.     Gait: Gait normal.  Psychiatric:        Mood and Affect: Mood normal.        Behavior: Behavior normal.        Thought Content: Thought content normal.      UC Treatments / Results  Labs (all labs ordered are listed, but only abnormal results are displayed) Labs Reviewed - No data to display  EKG   Radiology No results found.  Procedures Procedures (including critical care time)  Medications Ordered in UC Medications - No data to display  Initial Impression / Assessment and Plan / UC Course  I have reviewed the triage vital signs and the nursing notes.  Pertinent labs & imaging results that were available during my care of the patient were reviewed by me and considered in my medical decision making (see chart for details).   Based on patient's presentation and exam, suspect cervical strain with muscle cramps and spasms and possible herniated disc or pinched nerve at about C5-C6.  Since over-the-counter NSAIDs have failed, treating with prednisone at this time as well as baclofen.  Advised hydrocodone at bedtime as needed for severe pain.  Controlled substance database reviewed and patient low risk for abuse.  Advised stretches and to continue the heat therapy.  Follow-up with orthopedics if this is not getting better over the next 2 weeks or if it worsens.  ED precautions for neck pain discussed.  Final Clinical Impressions(s) / UC Diagnoses   Final diagnoses:  Acute strain of neck muscle, initial encounter  Cervical radiculopathy at C6  Muscle cramp     Discharge Instructions     NECK PAIN: Stressed avoiding painful  activities. This can exacerbate your symptoms and make them worse.  May apply heat to the areas of pain for some relief. Use medications as directed. Be aware of which medications make you drowsy and do not drive or operate any kind of heavy machinery while using the medication (ie pain medications or muscle relaxers). F/U with PCP for reexamination or return sooner if condition worsens or does not begin to improve over the next few days.   NECK PAIN RED FLAGS: If symptoms get worse than they are right now, you should come back sooner for re-evaluation. If you have increased numbness/ tingling or notice that the numbness/tingling is affecting the legs or saddle region, go to ER. If you ever lose continence go to ER.        ED Prescriptions    Medication Sig Dispense Auth. Provider   predniSONE (DELTASONE) 10 MG tablet Take 6 tablets by mouth on the first day and decrease by 1 tablet daily for the next 5 days 21 tablet Eusebio Friendly B, PA-C   baclofen (LIORESAL) 10 MG tablet Take 1 tablet (10 mg total) by mouth 3 (three) times daily as needed for up to 10 days for muscle spasms. 30 each Shirlee Latch, PA-C   HYDROcodone-acetaminophen (NORCO/VICODIN) 5-325 MG tablet Take 2 tablets by mouth every 6 (six) hours as needed for up to 3 days for severe pain. 10 tablet Shirlee Latch, PA-C     I have reviewed the PDMP during this encounter.   Shirlee Latch, PA-C 01/27/20 1447

## 2020-01-27 NOTE — ED Triage Notes (Signed)
Patient in today c/o neck and left shoulder pain x 10 days. Patient doesn't remember a specific injury, but sates he was carrying something heavy and thinks he may have pulled a muscle. Patient has taken OTC Tylenol without relief.

## 2020-02-09 ENCOUNTER — Encounter: Payer: Self-pay | Admitting: *Deleted

## 2020-04-13 IMAGING — CT CT CHEST LUNG CANCER SCREENING LOW DOSE W/O CM
2 of 5 series · 15 of 40 positions shown, 18 images · non-contrast
Comparison: No priors.

CLINICAL DATA: 57-year-old male current smoker with 33 pack-year
history of smoking. Lung cancer screening examination.

EXAM:
CT CHEST WITHOUT CONTRAST LOW-DOSE FOR LUNG CANCER SCREENING
TECHNIQUE: Multidetector CT imaging of the chest was performed following the
standard protocol without IV contrast.

[Series 3: lung · axial · 0.77mm/px · z∈[-1230,-935]mm · 12 of 325 slices shown, 15 images (1 of 2)]
[im 15/325  mediastinal]
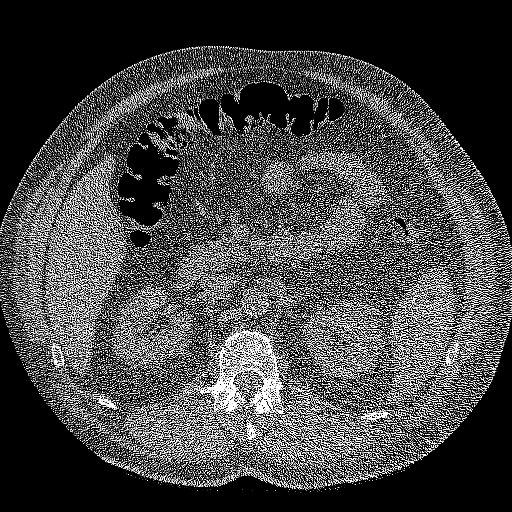
[im 15/325  lung]
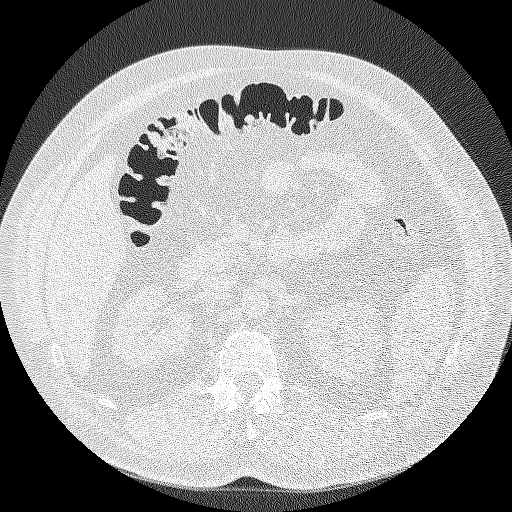
[im 45/325  lung]
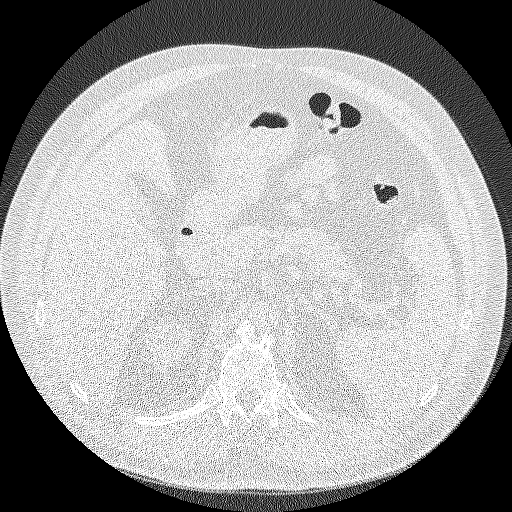
[im 74/325  lung]
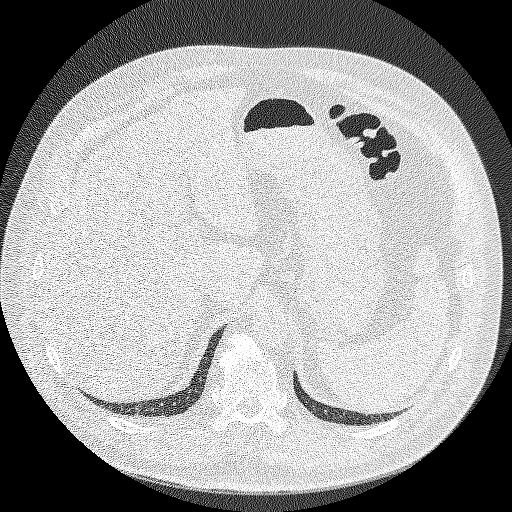
[im 104/325  lung]
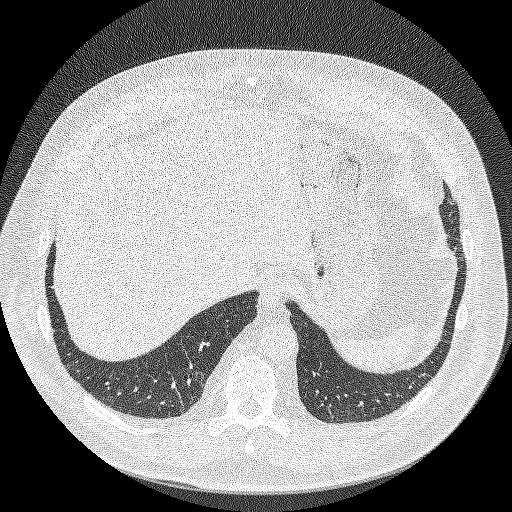
[im 118/325  mediastinal]
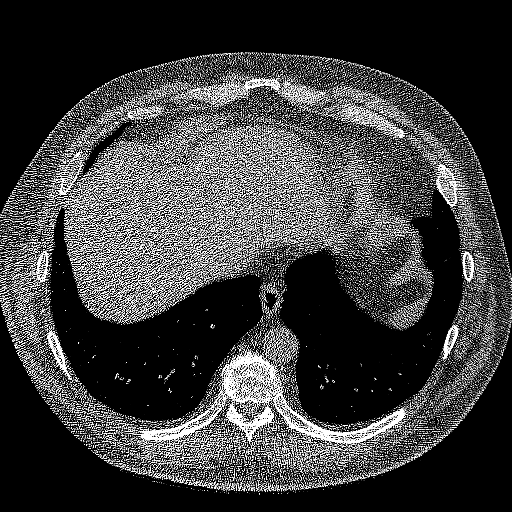
[im 118/325  lung]
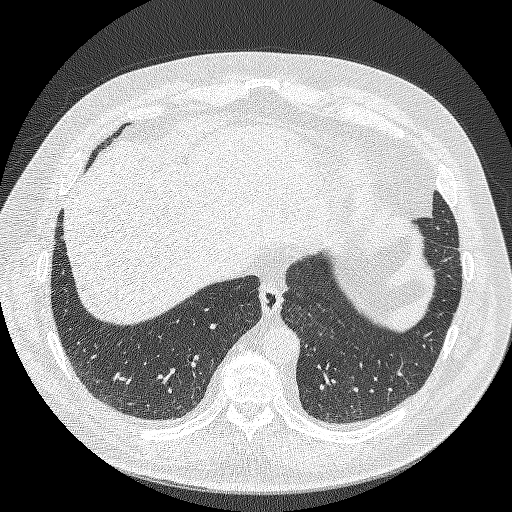
[im 148/325  lung]
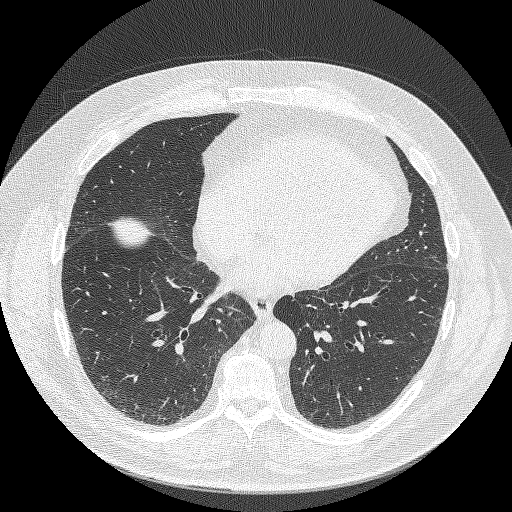
[im 177/325  lung]
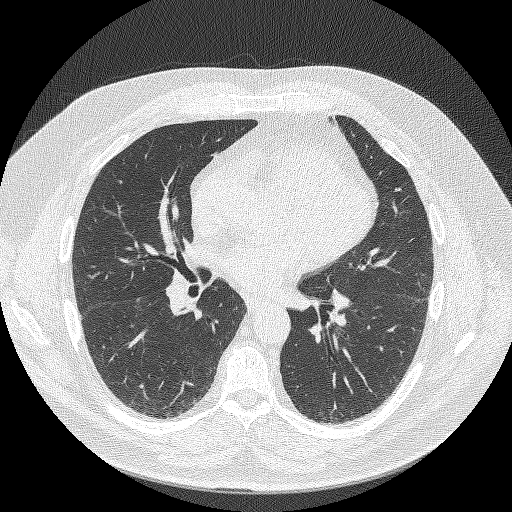
[im 207/325  lung]
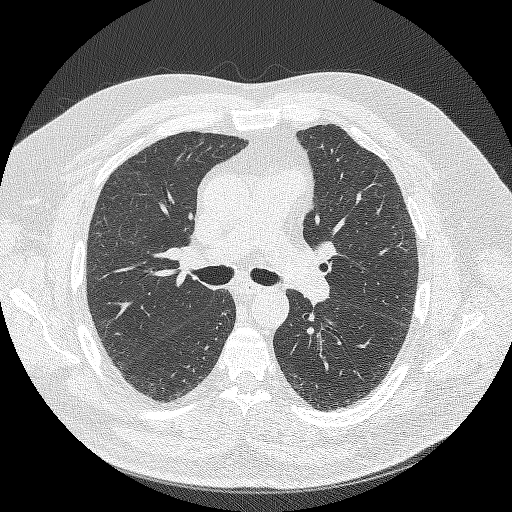
[im 221/325  mediastinal]
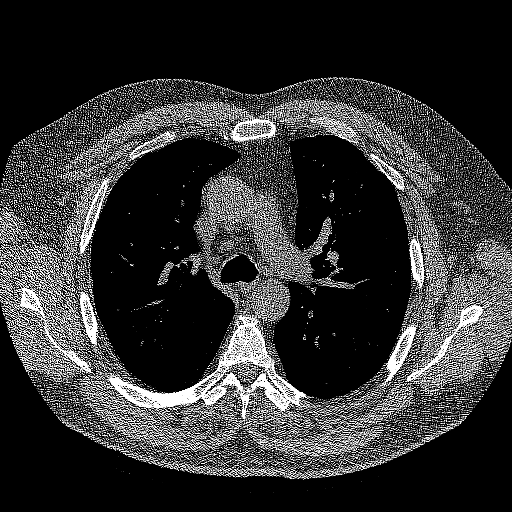
[im 221/325  lung]
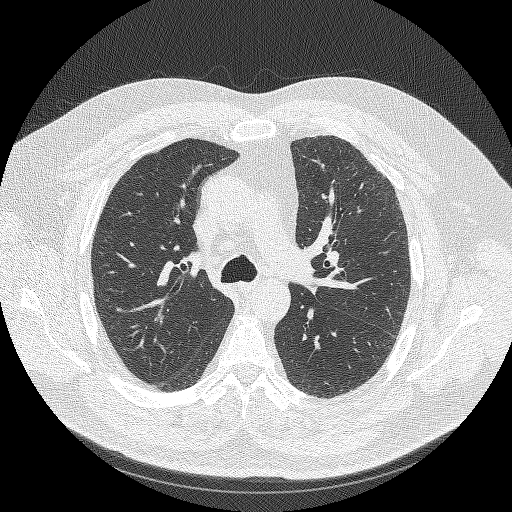
[im 251/325  lung]
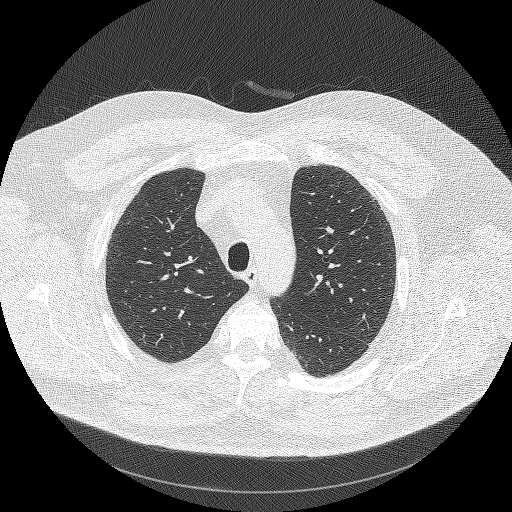
[im 280/325  lung]
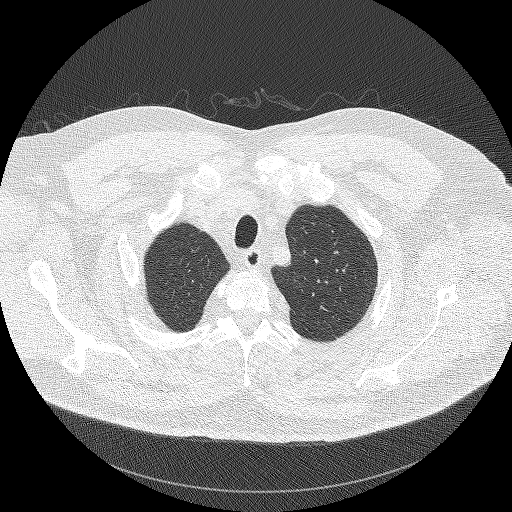
[im 310/325  lung]
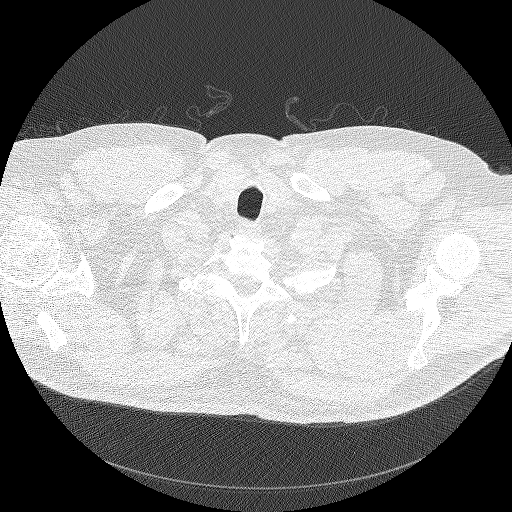

[Series 4: lung · coronal · 0.63mm/px · 3 of 345 slices shown (2 of 2)]
[im 69/345  lung]
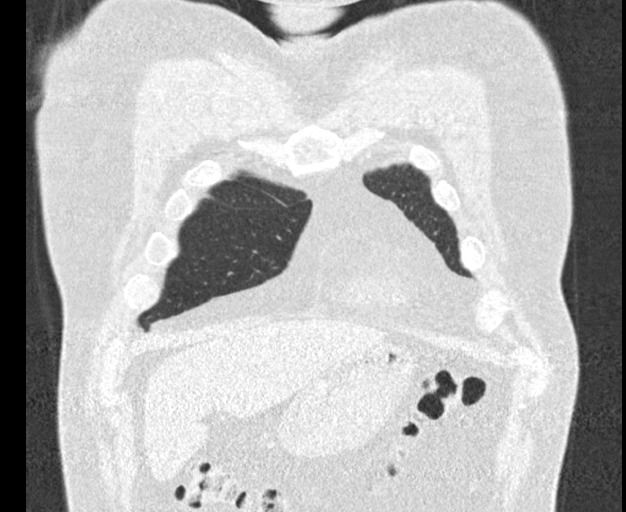
[im 138/345  lung]
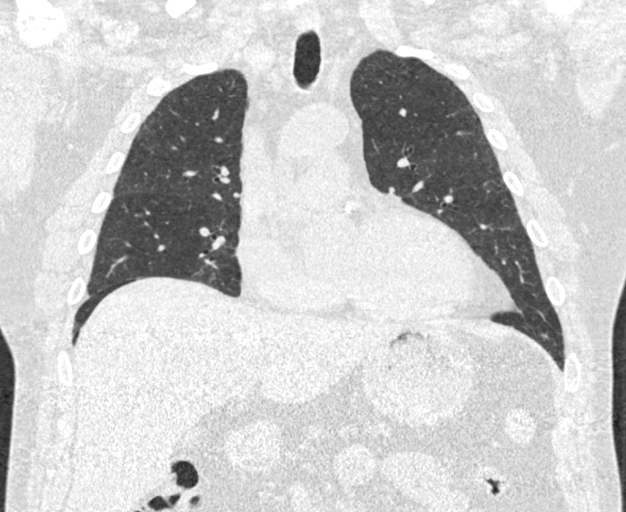
[im 207/345  lung]
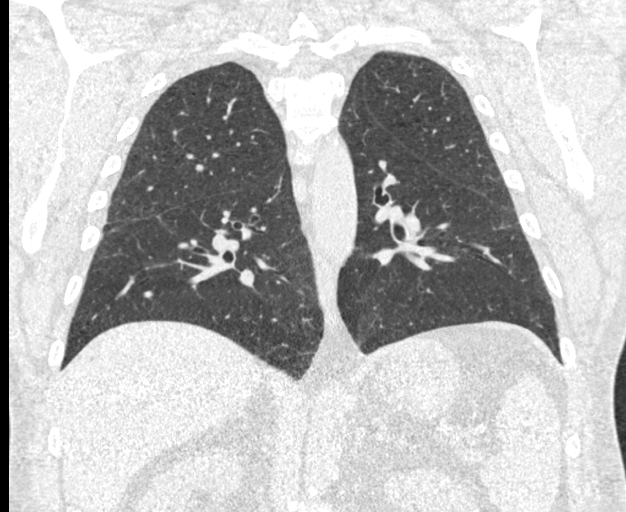

[15 of 40 positions shown; findings below may reference images not displayed]

FINDINGS: Cardiovascular: Heart size is normal. There is no significant
pericardial fluid, thickening or pericardial calcification. There is
aortic atherosclerosis, as well as atherosclerosis of the great
vessels of the mediastinum and the coronary arteries, including
calcified atherosclerotic plaque in the left anterior descending
coronary artery.

Mediastinum/Nodes: No pathologically enlarged mediastinal or hilar
lymph nodes. Please note that accurate exclusion of hilar adenopathy
is limited on noncontrast CT scans. Esophagus is unremarkable in
appearance. No axillary lymphadenopathy.

Lungs/Pleura: No suspicious pulmonary nodules or masses are noted.
No acute consolidative airspace disease. No pleural effusions. Mild
diffuse bronchial wall thickening with mild centrilobular and
paraseptal emphysema.

Upper Abdomen: Aortic atherosclerosis.

Musculoskeletal: There are no aggressive appearing lytic or blastic
lesions noted in the visualized portions of the skeleton.
IMPRESSION: 1. Lung-RADS 1S, negative. Continue annual screening with low-dose
chest CT without contrast in 12 months.
2. The "S" modifier above refers to potentially clinically
significant non lung cancer related findings. Specifically, there is
aortic atherosclerosis, in addition to left anterior descending
coronary artery disease. Please note that although the presence of
coronary artery calcium documents the presence of coronary artery
disease, the severity of this disease and any potential stenosis
cannot be assessed on this non-gated CT examination. Assessment for
potential risk factor modification, dietary therapy or pharmacologic
therapy may be warranted, if clinically indicated.
3. Mild diffuse bronchial wall thickening with mild centrilobular
and paraseptal emphysema; imaging findings suggestive of underlying
COPD.

Aortic Atherosclerosis (TRUNY-44P.P) and Emphysema (TRUNY-J88.I).

## 2021-09-20 ENCOUNTER — Telehealth: Payer: Self-pay | Admitting: *Deleted

## 2021-09-20 NOTE — Telephone Encounter (Signed)
Left message for patient to call back to schedule follow up lung cancer screening CT scan.  

## 2022-08-21 ENCOUNTER — Other Ambulatory Visit
Admission: RE | Admit: 2022-08-21 | Discharge: 2022-08-21 | Disposition: A | Discharge status: Home / Self Care | Payer: BC Managed Care – PPO | Source: Ambulatory Visit | Attending: Family Medicine | Admitting: Family Medicine

## 2022-08-21 DIAGNOSIS — R0789 Other chest pain: Secondary | ICD-10-CM | POA: Insufficient documentation

## 2022-08-21 LAB — TROPONIN I (HIGH SENSITIVITY): Troponin I (High Sensitivity): 5 ng/L (ref <18)

## 2022-08-23 ENCOUNTER — Other Ambulatory Visit
Admission: RE | Admit: 2022-08-23 | Discharge: 2022-08-23 | Disposition: A | Discharge status: Home / Self Care | Payer: BC Managed Care – PPO | Source: Ambulatory Visit | Attending: Internal Medicine | Admitting: Internal Medicine

## 2022-08-23 ENCOUNTER — Other Ambulatory Visit: Payer: Self-pay | Admitting: Internal Medicine

## 2022-08-23 DIAGNOSIS — Z01818 Encounter for other preprocedural examination: Secondary | ICD-10-CM | POA: Diagnosis present

## 2022-08-23 DIAGNOSIS — I200 Unstable angina: Secondary | ICD-10-CM

## 2022-08-23 DIAGNOSIS — I2089 Other forms of angina pectoris: Secondary | ICD-10-CM

## 2022-08-23 LAB — BRAIN NATRIURETIC PEPTIDE: B Natriuretic Peptide: 70.5 pg/mL (ref 0.0–100.0)

## 2022-08-26 DIAGNOSIS — R079 Chest pain, unspecified: Secondary | ICD-10-CM

## 2022-08-29 ENCOUNTER — Ambulatory Visit: Admission: RE | Admit: 2022-08-29 | Payer: BC Managed Care – PPO | Source: Ambulatory Visit

## 2022-08-29 ENCOUNTER — Ambulatory Visit
Admission: RE | Admit: 2022-08-29 | Discharge: 2022-08-29 | Disposition: A | Discharge status: Home / Self Care | Payer: BC Managed Care – PPO | Attending: Internal Medicine | Admitting: Internal Medicine

## 2022-08-29 ENCOUNTER — Encounter
Admission: RE | Disposition: A | Discharge status: Home / Self Care | Payer: Self-pay | Source: Home / Self Care | Attending: Internal Medicine

## 2022-08-29 ENCOUNTER — Other Ambulatory Visit: Payer: Self-pay

## 2022-08-29 ENCOUNTER — Encounter: Payer: Self-pay | Admitting: Internal Medicine

## 2022-08-29 DIAGNOSIS — R079 Chest pain, unspecified: Secondary | ICD-10-CM

## 2022-08-29 DIAGNOSIS — I517 Cardiomegaly: Secondary | ICD-10-CM | POA: Diagnosis not present

## 2022-08-29 HISTORY — PX: LEFT HEART CATH AND CORONARY ANGIOGRAPHY: CATH118249

## 2022-08-29 LAB — CARDIAC CATHETERIZATION: Cath EF Quantitative: 55 %

## 2022-08-29 SURGERY — LEFT HEART CATH AND CORONARY ANGIOGRAPHY
Anesthesia: Moderate Sedation | Laterality: Left

## 2022-08-29 MED ORDER — HEPARIN (PORCINE) IN NACL 1000-0.9 UT/500ML-% IV SOLN
INTRAVENOUS | Status: AC
  Filled 2022-08-29: qty 1000

## 2022-08-29 MED ORDER — SODIUM CHLORIDE 0.9 % WEIGHT BASED INFUSION: 100.0000 mL/h | INTRAVENOUS | Status: DC

## 2022-08-29 MED ORDER — ACETAMINOPHEN 325 MG PO TABS: 650.0000 mg | ORAL_TABLET | ORAL | Status: DC | PRN

## 2022-08-29 MED ORDER — MIDAZOLAM HCL 2 MG/2ML IJ SOLN
INTRAMUSCULAR | Status: DC | PRN
  Administered 2022-08-29 (×2): 1 mg via INTRAVENOUS

## 2022-08-29 MED ORDER — SODIUM CHLORIDE 0.9 % IV SOLN: 250.0000 mL | INTRAVENOUS | Status: DC | PRN

## 2022-08-29 MED ORDER — FENTANYL CITRATE (PF) 100 MCG/2ML IJ SOLN
INTRAMUSCULAR | Status: DC | PRN
  Administered 2022-08-29: 50 ug via INTRAVENOUS

## 2022-08-29 MED ORDER — ONDANSETRON HCL 4 MG/2ML IJ SOLN: 4.0000 mg | Freq: Four times a day (QID) | INTRAMUSCULAR | Status: DC | PRN

## 2022-08-29 MED ORDER — IOHEXOL 300 MG/ML  SOLN
INTRAMUSCULAR | Status: DC | PRN
  Administered 2022-08-29: 150 mL

## 2022-08-29 MED ORDER — SODIUM CHLORIDE 0.9% FLUSH: 3.0000 mL | Freq: Two times a day (BID) | INTRAVENOUS | Status: DC

## 2022-08-29 MED ORDER — SODIUM CHLORIDE 0.9% FLUSH: 3.0000 mL | INTRAVENOUS | Status: DC | PRN

## 2022-08-29 MED ORDER — HEPARIN SODIUM (PORCINE) 1000 UNIT/ML IJ SOLN
INTRAMUSCULAR | Status: AC
  Filled 2022-08-29: qty 10

## 2022-08-29 MED ORDER — HEPARIN (PORCINE) IN NACL 1000-0.9 UT/500ML-% IV SOLN
INTRAVENOUS | Status: DC | PRN
  Administered 2022-08-29 (×2): 500 mL

## 2022-08-29 MED ORDER — SODIUM CHLORIDE 0.9 % WEIGHT BASED INFUSION: 1.0000 mL/kg/h | INTRAVENOUS | Status: DC

## 2022-08-29 MED ORDER — ASPIRIN 81 MG PO CHEW
CHEWABLE_TABLET | ORAL | Status: AC
  Filled 2022-08-29: qty 1

## 2022-08-29 MED ORDER — MIDAZOLAM HCL 2 MG/2ML IJ SOLN
INTRAMUSCULAR | Status: AC
  Filled 2022-08-29: qty 2

## 2022-08-29 MED ORDER — VERAPAMIL HCL 2.5 MG/ML IV SOLN
INTRAVENOUS | Status: AC
  Filled 2022-08-29: qty 2

## 2022-08-29 MED ORDER — FENTANYL CITRATE (PF) 100 MCG/2ML IJ SOLN
INTRAMUSCULAR | Status: AC
  Filled 2022-08-29: qty 2

## 2022-08-29 MED ORDER — HYDRALAZINE HCL 20 MG/ML IJ SOLN: 10.0000 mg | INTRAMUSCULAR | Status: DC | PRN

## 2022-08-29 MED ORDER — SODIUM CHLORIDE 0.9 % WEIGHT BASED INFUSION
300.0000 mL/h | INTRAVENOUS | Status: AC
  Administered 2022-08-29: 300 mL/h via INTRAVENOUS

## 2022-08-29 MED ORDER — ASPIRIN 81 MG PO CHEW
81.0000 mg | CHEWABLE_TABLET | ORAL | Status: AC
  Administered 2022-08-29: 81 mg via ORAL

## 2022-08-29 MED ORDER — LABETALOL HCL 5 MG/ML IV SOLN: 10.0000 mg | INTRAVENOUS | Status: DC | PRN

## 2022-08-29 SURGICAL SUPPLY — 15 items
CATH INFINITI 5FR JL5 (CATHETERS) IMPLANT
CATH INFINITI 5FR MULTPACK ANG (CATHETERS) IMPLANT
DEVICE CLOSURE MYNXGRIP 5F (Vascular Products) IMPLANT
DRAPE BRACHIAL (DRAPES) IMPLANT
GLIDESHEATH SLEND SS 6F .021 (SHEATH) IMPLANT
GUIDEWIRE INQWIRE 1.5J.035X260 (WIRE) IMPLANT
INQWIRE 1.5J .035X260CM (WIRE) ×1
NDL PERC 18GX7CM (NEEDLE) IMPLANT
NEEDLE PERC 18GX7CM (NEEDLE) ×1 IMPLANT
PACK CARDIAC CATH (CUSTOM PROCEDURE TRAY) ×1 IMPLANT
PROTECTION STATION PRESSURIZED (MISCELLANEOUS) ×1
SET ATX-X65L (MISCELLANEOUS) IMPLANT
SHEATH AVANTI 5FR X 11CM (SHEATH) IMPLANT
STATION PROTECTION PRESSURIZED (MISCELLANEOUS) IMPLANT
WIRE GUIDERIGHT .035X150 (WIRE) IMPLANT

## 2022-08-30 ENCOUNTER — Encounter: Payer: Self-pay | Admitting: Internal Medicine
